# Patient Record
Sex: Male | Born: 1960 | Race: White | Hispanic: No | Marital: Married
Health system: Southern US, Community
[De-identification: ages and names within clinical notes are randomized; demographics above are authoritative.]

---

## 2011-02-20 ENCOUNTER — Observation Stay: Payer: Self-pay | Admitting: Surgery

## 2012-10-21 IMAGING — US ABDOMEN ULTRASOUND
1 series · 17 of 25 positions shown · non-contrast
Comparison: none

REASON FOR EXAM: abd apin intermittent severe,. evaul to include liver,
GB, pancrease, kidneys an
COMMENTS:

[Series 1: abdomen ultrasound · 17 of 93 slices shown]
[im 1/93]
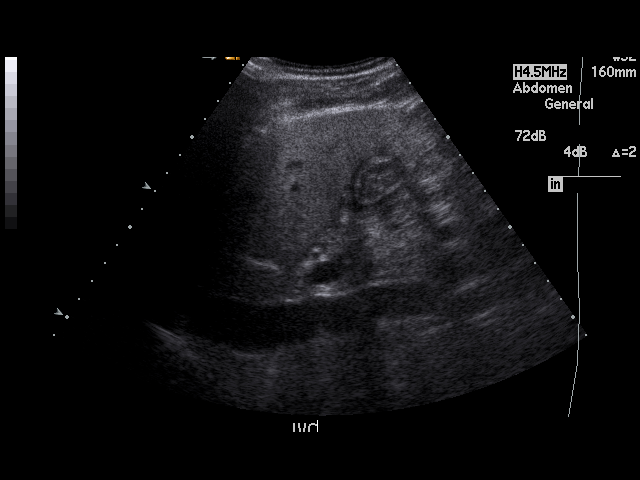
[im 8/93]
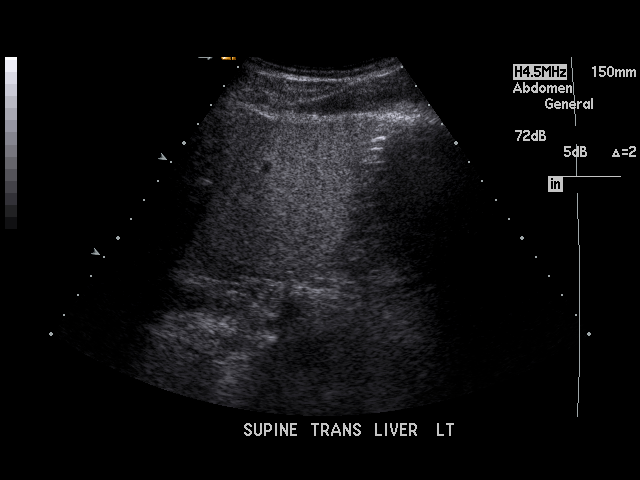
[im 12/93]
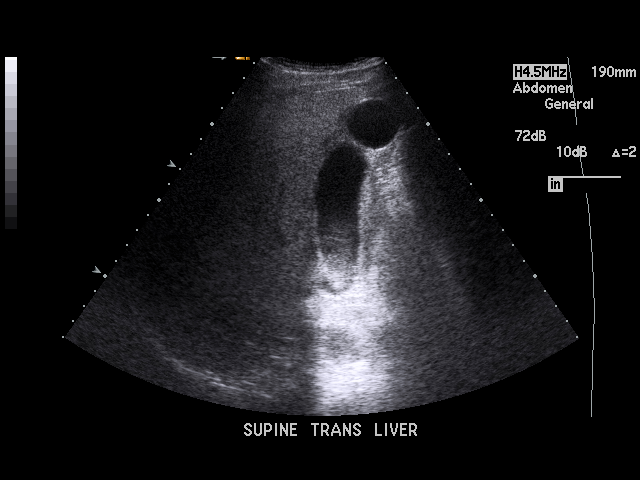
[im 20/93]
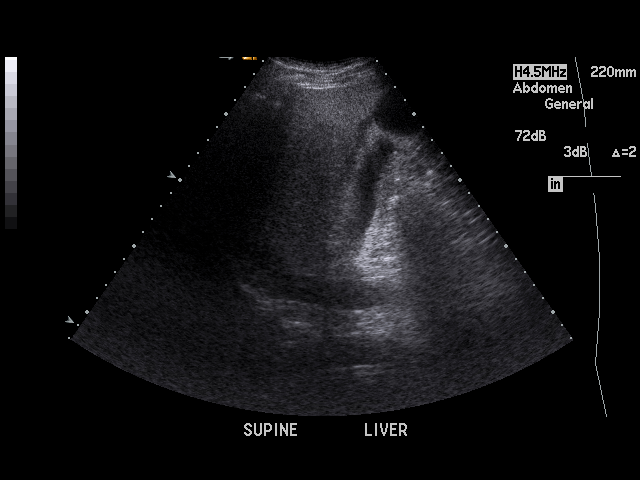
[im 24/93]
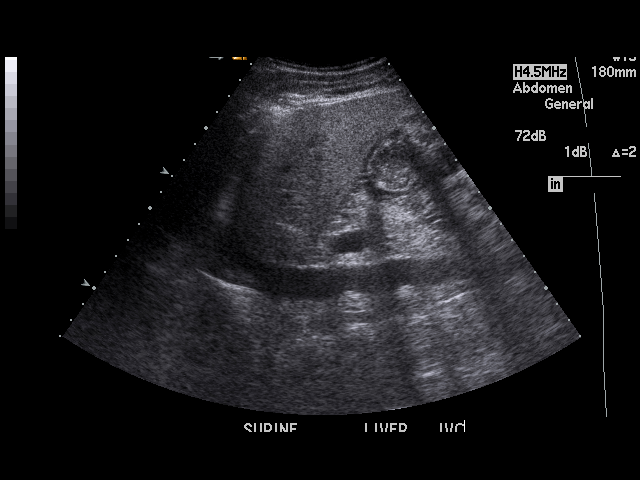
[im 31/93]
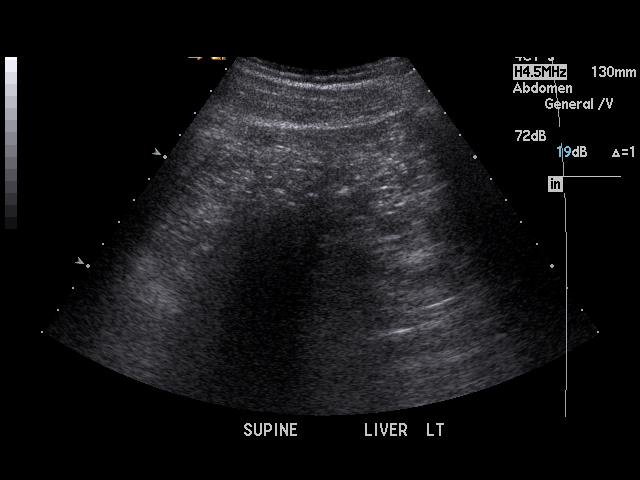
[im 35/93]
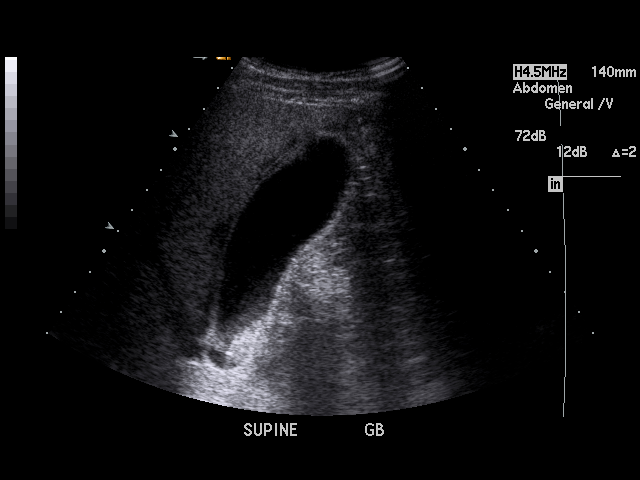
[im 43/93]
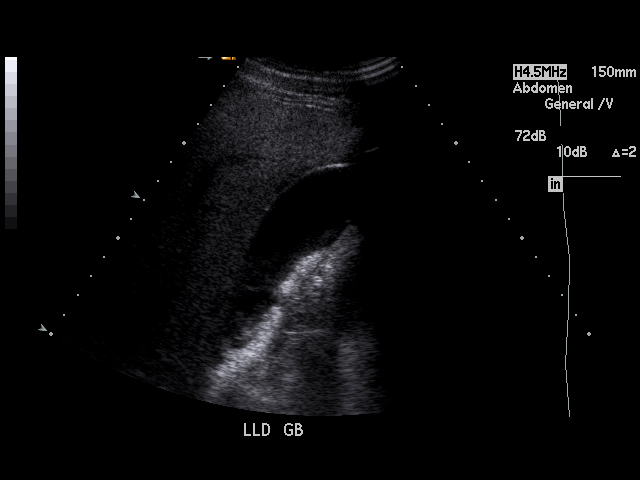
[im 47/93]
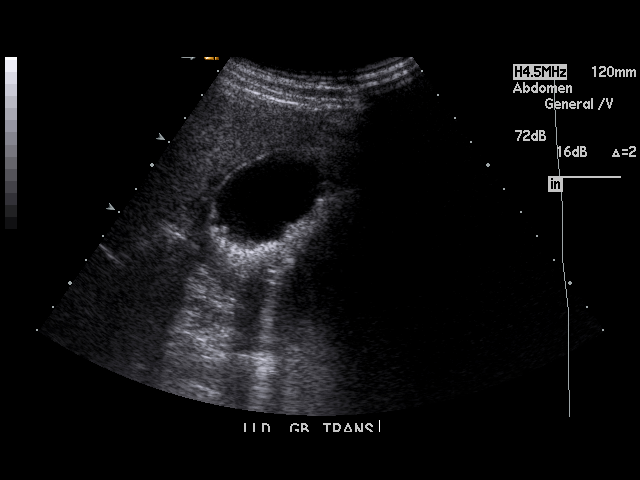
[im 50/93]
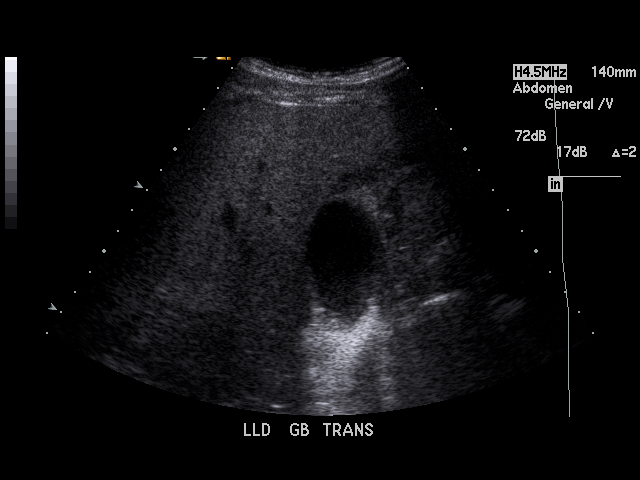
[im 58/93]
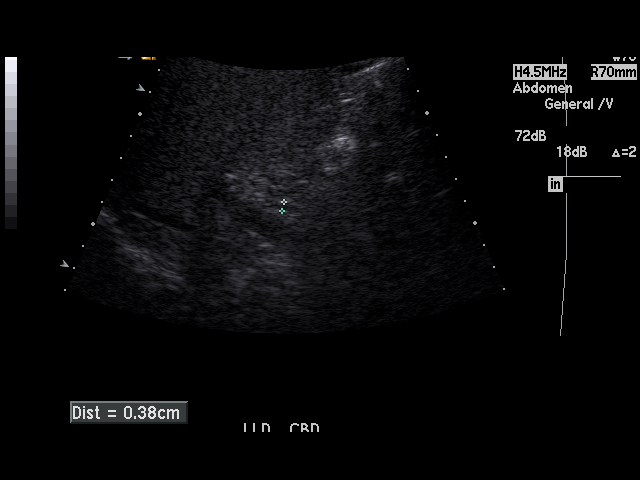
[im 62/93]
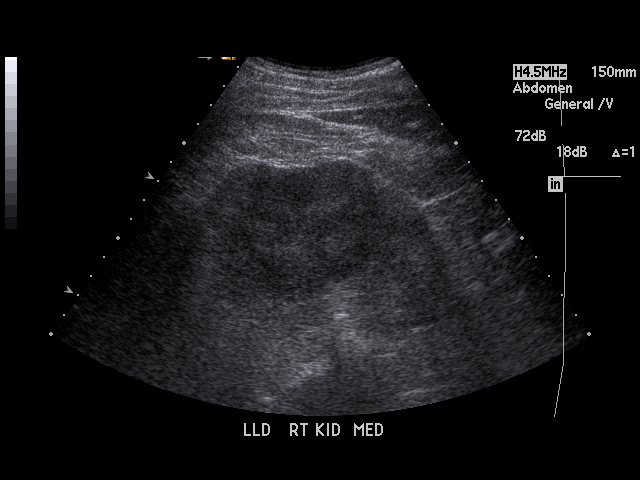
[im 70/93]
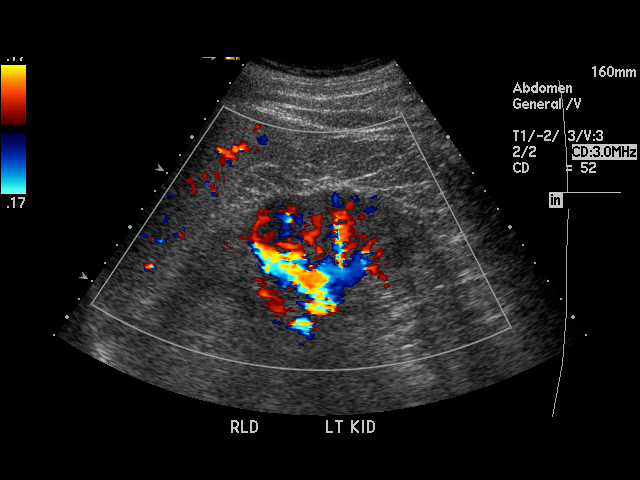
[im 73/93]
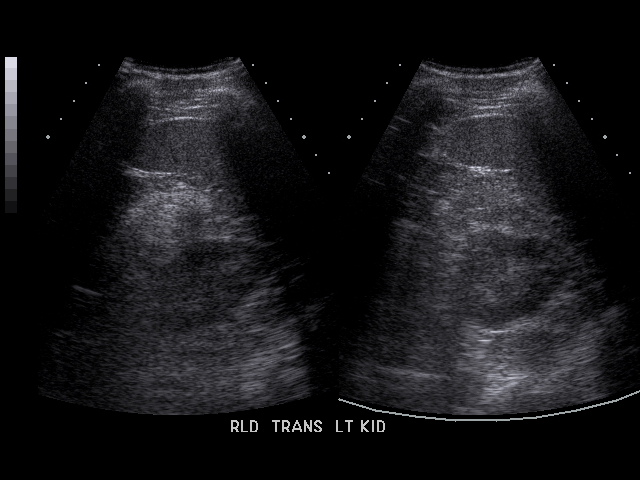
[im 81/93]
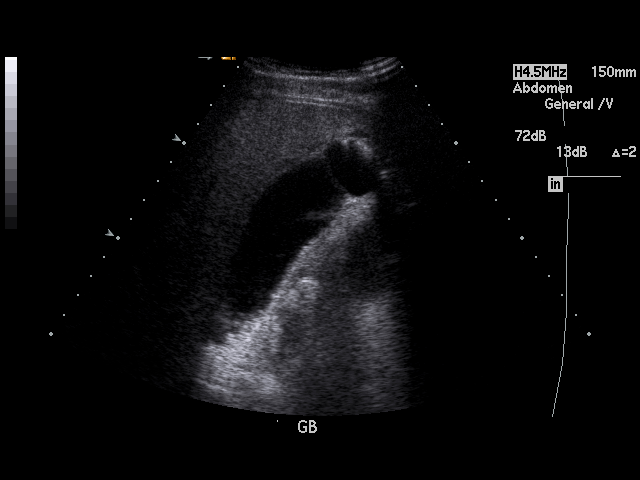
[im 85/93]
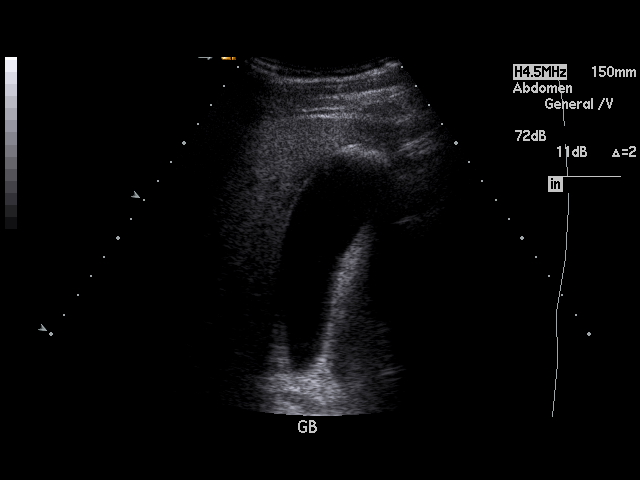
[im 93/93]
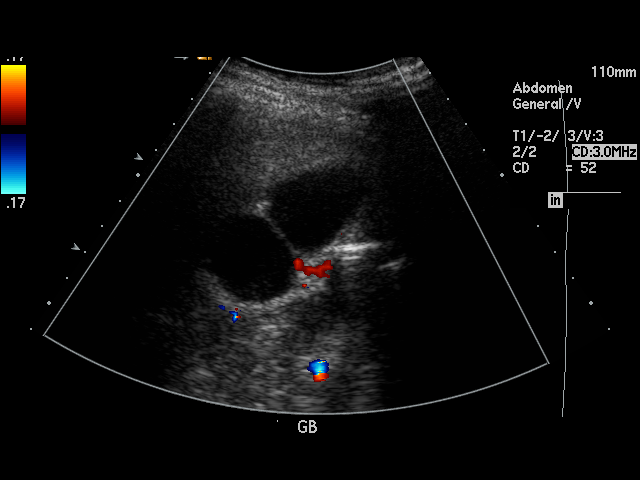

[17 of 25 positions shown; findings below may reference images not displayed]

PROCEDURE:     US  - US ABDOMEN GENERAL SURVEY  - February 20, 2011  [DATE]

RESULT:     The liver is hyperechoic consistent with fatty infiltration.
There is observed a focal area of low density medially in the liver
consistent with fatty infiltration. The head and body of the pancreas are
normal in appearance. The pancreatic tail is obscured by bowel gas. The
spleen size is normal. The abdominal aorta and inferior vena cava show no
significant abnormalities. There are noted echo densities in the gallbladder
compatible with gallstones. A small amount of sludge is also present. There
are noted focal areas of thickening of the gallbladder wall. In some areas,
the gallbladder wall measures 4.3 mm in thickness. The possibility of
cholecystitis cannot be excluded. Note is made that the gallbladder could be
further evaluated with Biliary Nuclide Scan, if clinically indicated. The
common bile duct measures 3.8 mm in diameter which is within normal limits.
The kidneys show no hydronephrosis. Sagittally, the right kidney measures
10.9 cm and the left measures 12.76 cm.
IMPRESSION: 1.  There are observed changes consistent with fatty infiltration of the
liver.
2.  Cholelithiasis with there being a small amount of associated sludge in
the gallbladder and focal areas of gallbladder wall thickening. The
possibility of cholecystitis cannot be excluded. The gallbladder could be
further evaluated with Biliary Nuclide Scan, if clinically indicated.
3.  No ascites is seen.

## 2017-08-30 ENCOUNTER — Other Ambulatory Visit: Payer: Self-pay | Admitting: *Deleted

## 2017-08-30 DIAGNOSIS — R945 Abnormal results of liver function studies: Secondary | ICD-10-CM

## 2017-09-07 ENCOUNTER — Ambulatory Visit
Admission: RE | Admit: 2017-09-07 | Discharge: 2017-09-07 | Disposition: A | Discharge status: Home / Self Care | Payer: 59 | Source: Ambulatory Visit | Attending: *Deleted | Admitting: *Deleted

## 2017-09-07 DIAGNOSIS — R945 Abnormal results of liver function studies: Secondary | ICD-10-CM

## 2019-04-16 IMAGING — US US ABDOMEN LIMITED
1 series · 14 of 25 positions shown · non-contrast
Comparison: Ultrasound 02/20/2011.

CLINICAL DATA: Abnormal LFTs.  Prior cholecystectomy.

EXAM:
ULTRASOUND ABDOMEN LIMITED RIGHT UPPER QUADRANT

[Series 1: us abdomen limited · 0.26mm/px · 14 of 28 slices shown]
[im 1/28]
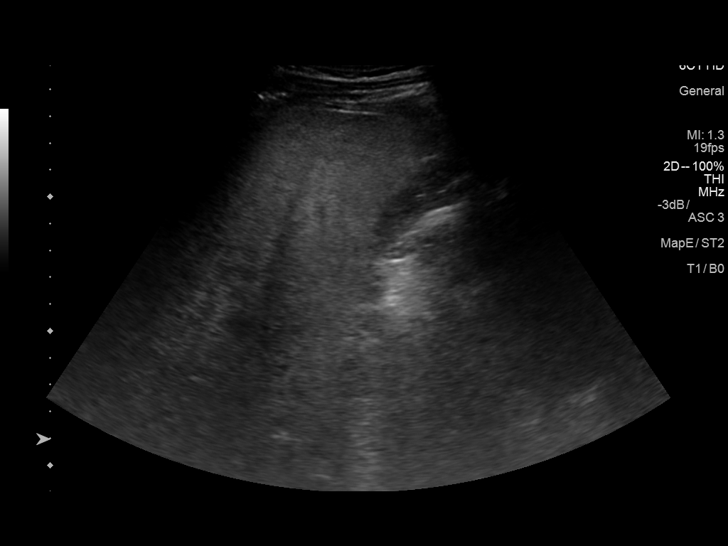
[im 3/28]
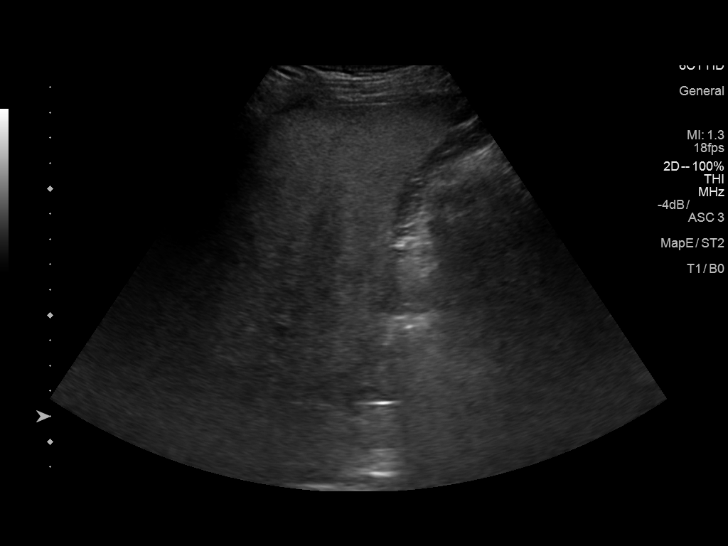
[im 5/28]
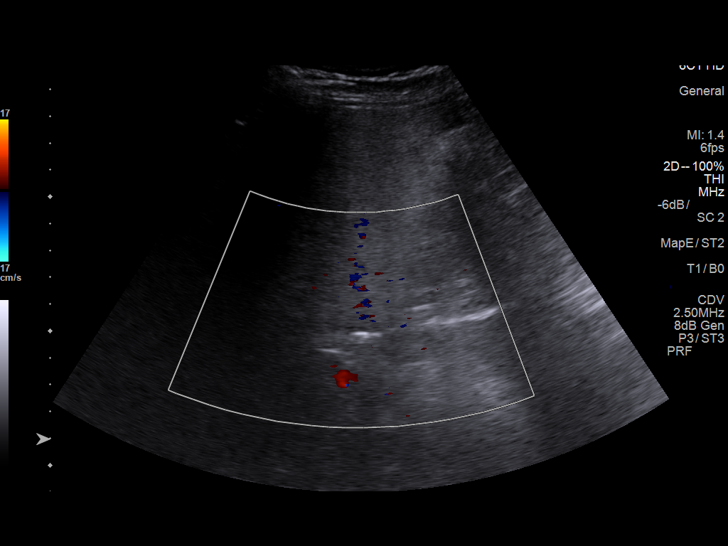
[im 7/28]
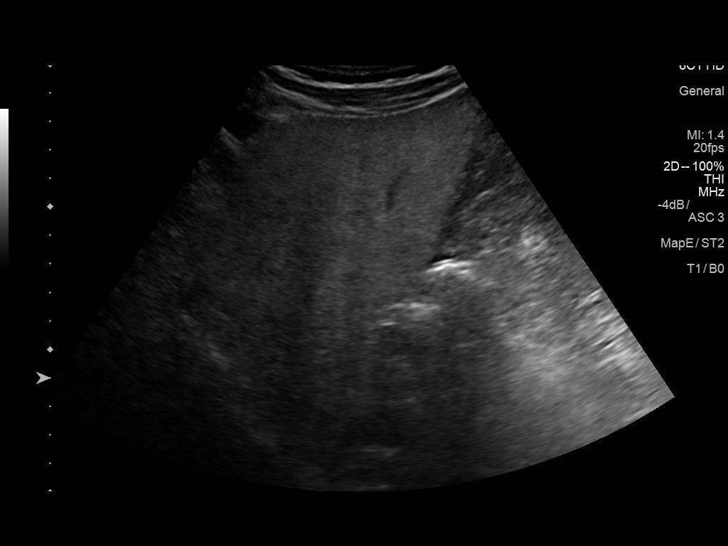
[im 10/28]
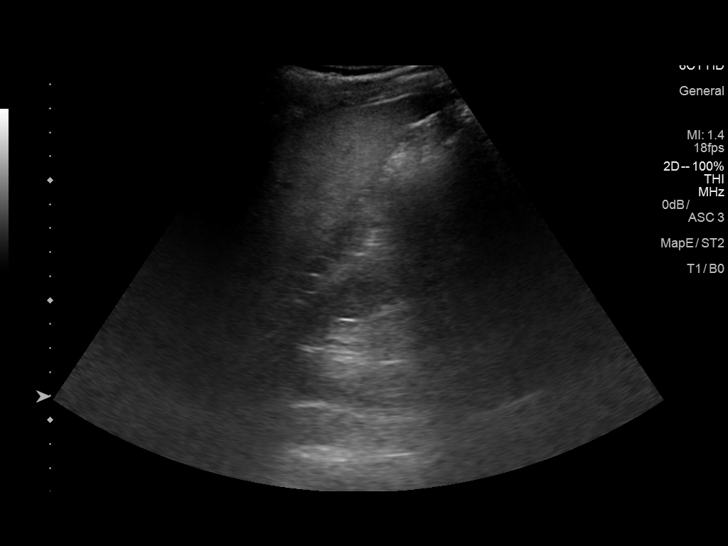
[im 11/28]
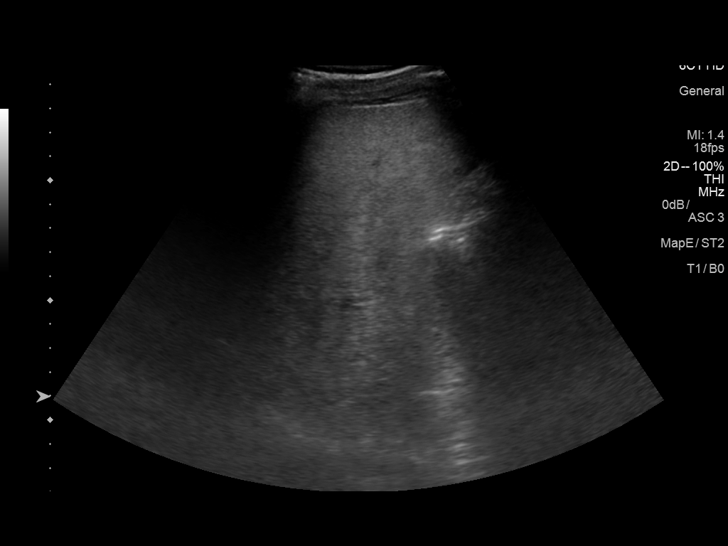
[im 13/28]
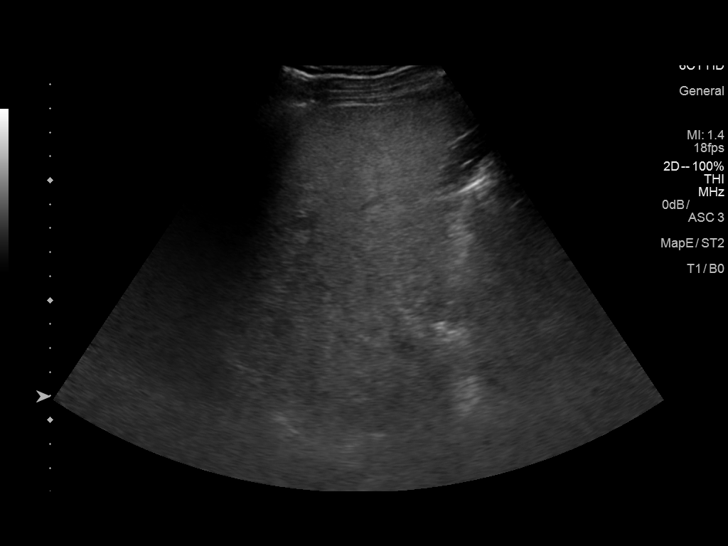
[im 15/28]
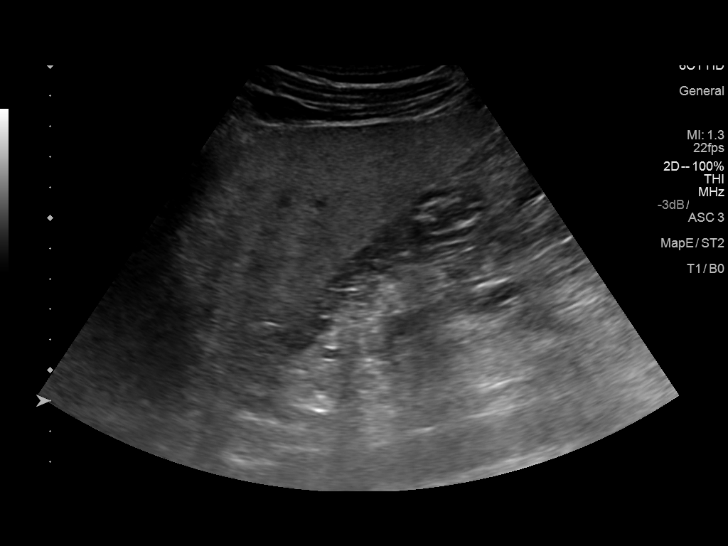
[im 17/28]
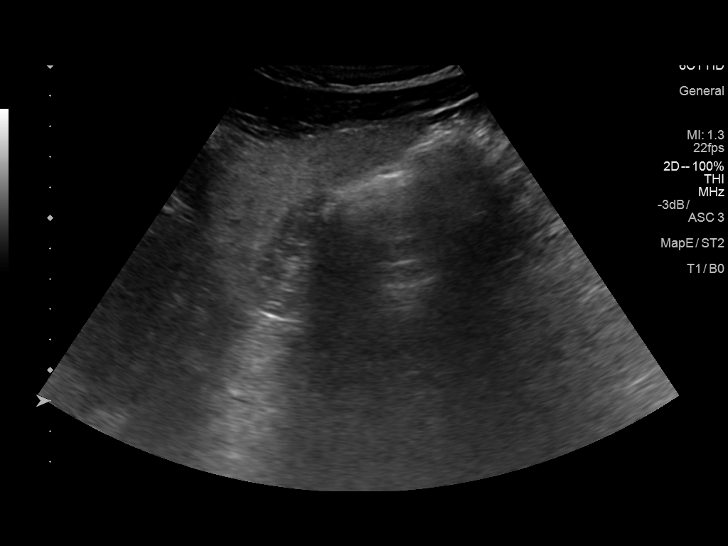
[im 19/28]
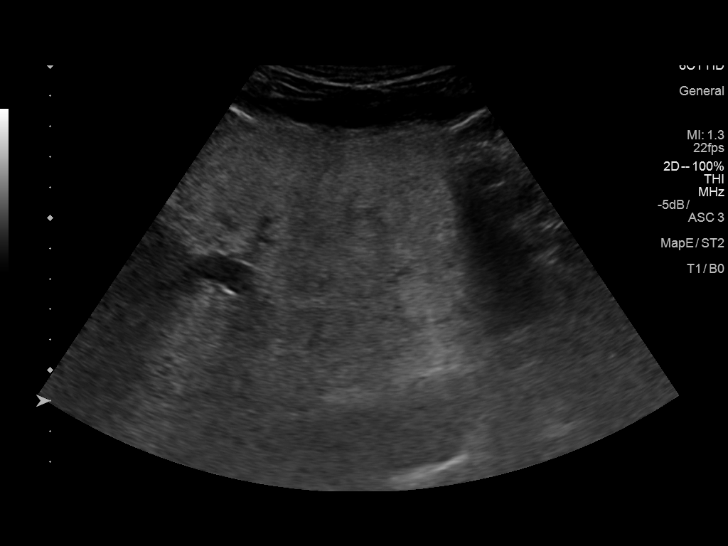
[im 21/28]
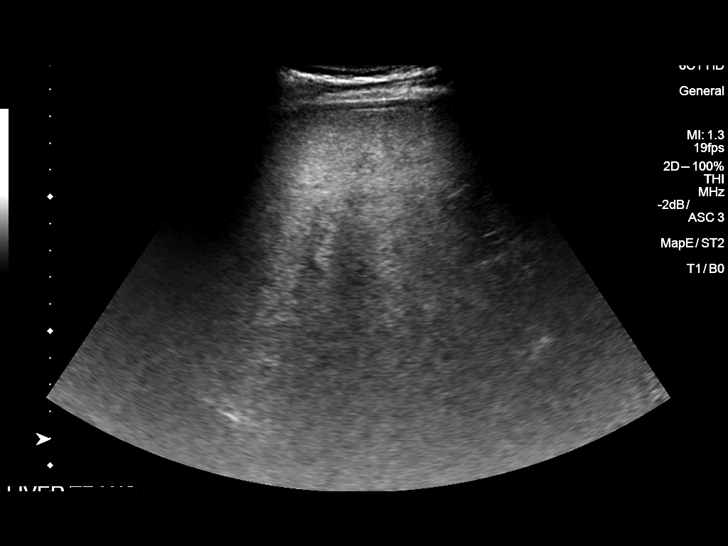
[im 23/28]
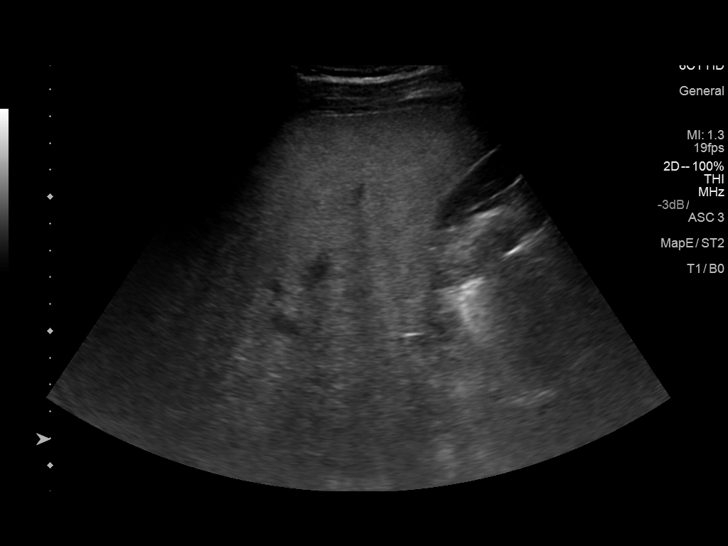
[im 25/28]
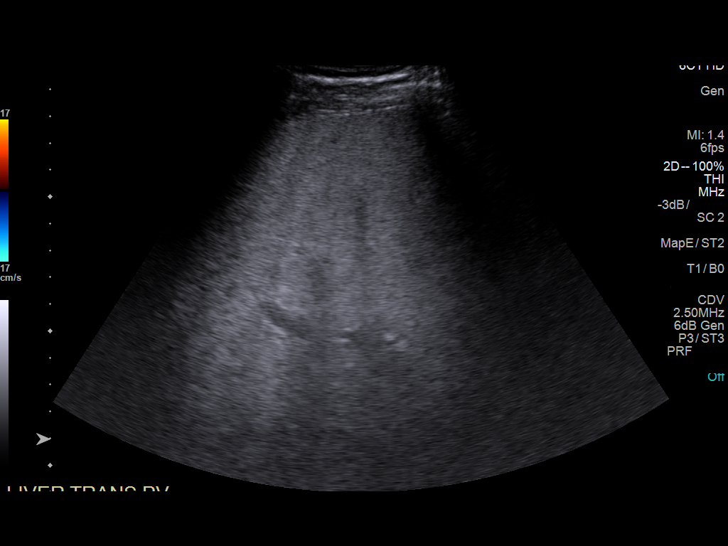
[im 28/28]
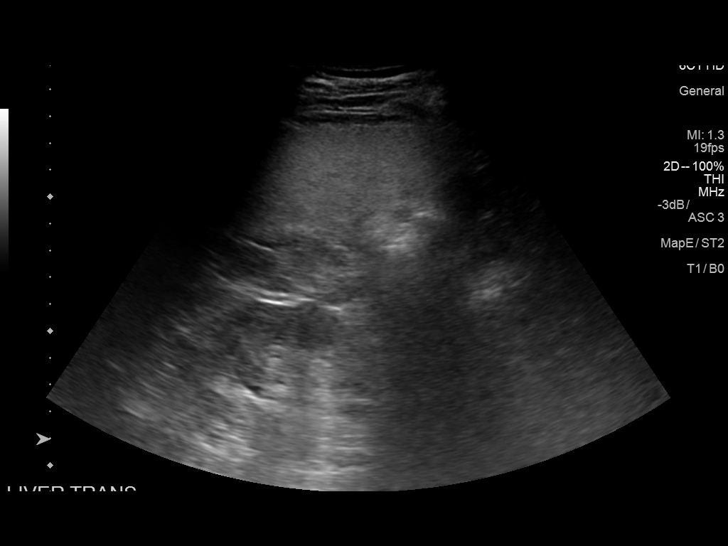

[14 of 25 positions shown; findings below may reference images not displayed]

FINDINGS: Gallbladder:

Cholecystectomy.

Common bile duct:

Diameter: 2.9 mm

Liver:

Heterogeneous hepatic parenchymal pattern suggesting fatty
infiltration and/or hepatocellular disease. This results in limited
evaluation of liver. No focal hepatic abnormality identified. Portal
vein is patent on color Doppler imaging with normal direction of
blood flow towards the liver.
IMPRESSION: 1. Heterogeneous hepatic parenchymal pattern most consistent fatty
infiltration and/or hepatocellular disease.

2.  Cholecystectomy.  No biliary distention.

## 2019-08-29 ENCOUNTER — Other Ambulatory Visit: Payer: Self-pay

## 2019-08-29 ENCOUNTER — Ambulatory Visit: Payer: 59 | Attending: Internal Medicine

## 2019-08-29 DIAGNOSIS — Z23 Encounter for immunization: Secondary | ICD-10-CM | POA: Insufficient documentation

## 2019-08-29 NOTE — Progress Notes (Signed)
   Covid-[facility] Vaccination [facility]  Name:  Derek Mcbride    MRN: [id number] DOB: 1961/03/11  08/29/2019  [name] was observed post Covid-19 immunization for 15 minutes without incident. He was provided with Vaccine Information Sheet and instruction to access the V-Safe system.   [name] was instructed to call 911 with any severe reactions post vaccine: Marland Kitchen Difficulty breathing  . Swelling of face and throat  . A fast heartbeat  . A bad rash all over body  . Dizziness and weakness   Immunizations Administered    Name Date Dose VIS Date Route   Moderna COVID-19 Vaccine 08/29/2019 10:04 AM 0.5 mL 05/27/2019 Intramuscular   Manufacturer: Moderna   Lot: [id number]   NDC: 58346-219-47

## 2019-09-30 ENCOUNTER — Ambulatory Visit: Payer: 59 | Attending: Internal Medicine

## 2019-09-30 DIAGNOSIS — Z23 Encounter for immunization: Secondary | ICD-10-CM

## 2019-09-30 NOTE — Progress Notes (Signed)
   Covid-19 Vaccination Clinic  Name:  Derek Mcbride    MRN: [id number] DOB: Nov 20, 1960  09/30/2019  Mr. Derek Mcbride was observed post Covid-19 immunization for 15 minutes without incident. He was provided with Vaccine Information Sheet and instruction to access the V-Safe system.   Mr. Derek Mcbride was instructed to call 911 with any severe reactions post vaccine: Marland Kitchen Difficulty breathing  . Swelling of face and throat  . A fast heartbeat  . A bad rash all over body  . Dizziness and weakness   Immunizations Administered    Name Date Dose VIS Date Route   Moderna COVID-19 Vaccine 09/30/2019 10:07 AM 0.5 mL 05/27/2019 Intramuscular   Manufacturer: Moderna   Lot: [id number]   NDC: 22575-051-83
